# Patient Record
Sex: Female | Born: 1960 | Race: White | Hispanic: No | Marital: Married | Smoking: Never smoker
Health system: Southern US, Community
[De-identification: ages and names within clinical notes are randomized; demographics above are authoritative.]

## PROBLEM LIST (undated history)

## (undated) DIAGNOSIS — J45909 Unspecified asthma, uncomplicated: Secondary | ICD-10-CM

---

## 2009-02-05 ENCOUNTER — Ambulatory Visit (HOSPITAL_BASED_OUTPATIENT_CLINIC_OR_DEPARTMENT_OTHER): Admission: RE | Admit: 2009-02-05 | Discharge: 2009-02-05 | Payer: Self-pay | Admitting: Family Medicine

## 2009-02-05 ENCOUNTER — Ambulatory Visit: Payer: Self-pay | Admitting: Diagnostic Radiology

## 2013-12-09 ENCOUNTER — Emergency Department (HOSPITAL_BASED_OUTPATIENT_CLINIC_OR_DEPARTMENT_OTHER)
Admission: EM | Admit: 2013-12-09 | Discharge: 2013-12-09 | Disposition: A | Discharge status: Home / Self Care | Payer: BC Managed Care – PPO | Attending: Emergency Medicine | Admitting: Emergency Medicine

## 2013-12-09 ENCOUNTER — Encounter (HOSPITAL_BASED_OUTPATIENT_CLINIC_OR_DEPARTMENT_OTHER): Payer: Self-pay | Admitting: Emergency Medicine

## 2013-12-09 DIAGNOSIS — Z79899 Other long term (current) drug therapy: Secondary | ICD-10-CM | POA: Insufficient documentation

## 2013-12-09 DIAGNOSIS — S0990XA Unspecified injury of head, initial encounter: Secondary | ICD-10-CM | POA: Insufficient documentation

## 2013-12-09 DIAGNOSIS — Y9389 Activity, other specified: Secondary | ICD-10-CM | POA: Insufficient documentation

## 2013-12-09 DIAGNOSIS — IMO0002 Reserved for concepts with insufficient information to code with codable children: Secondary | ICD-10-CM | POA: Diagnosis not present

## 2013-12-09 DIAGNOSIS — S3981XA Other specified injuries of abdomen, initial encounter: Secondary | ICD-10-CM | POA: Insufficient documentation

## 2013-12-09 DIAGNOSIS — M791 Myalgia, unspecified site: Secondary | ICD-10-CM

## 2013-12-09 DIAGNOSIS — Y92410 Unspecified street and highway as the place of occurrence of the external cause: Secondary | ICD-10-CM | POA: Diagnosis not present

## 2013-12-09 DIAGNOSIS — J45909 Unspecified asthma, uncomplicated: Secondary | ICD-10-CM | POA: Insufficient documentation

## 2013-12-09 HISTORY — DX: Unspecified asthma, uncomplicated: J45.909

## 2013-12-09 MED ORDER — ACETAMINOPHEN 325 MG PO TABS
650.0000 mg | ORAL_TABLET | Freq: Four times a day (QID) | ORAL | Status: DC | PRN
  Administered 2013-12-09: 650 mg via ORAL
  Filled 2013-12-09: qty 2

## 2013-12-09 MED ORDER — ONDANSETRON 4 MG PO TBDP
4.0000 mg | ORAL_TABLET | Freq: Once | ORAL | Status: AC
  Administered 2013-12-09: 4 mg via ORAL
  Filled 2013-12-09: qty 1

## 2013-12-09 NOTE — ED Notes (Signed)
Provider at bedside for pt eval.
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
## 2013-12-09 NOTE — ED Provider Notes (Signed)
Medical screening examination/treatment/procedure(s) were performed by non-physician practitioner and as supervising physician I was immediately available for consultation/collaboration.   EKG Interpretation None        Vanetta MuldersScott Dailee Manalang, MD 12/09/13 1150
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
## 2014-01-20 ENCOUNTER — Other Ambulatory Visit: Payer: Self-pay | Admitting: Physician Assistant

## 2014-01-20 DIAGNOSIS — R1012 Left upper quadrant pain: Secondary | ICD-10-CM

## 2014-01-29 ENCOUNTER — Other Ambulatory Visit: Payer: BC Managed Care – PPO

## 2014-02-12 ENCOUNTER — Ambulatory Visit (INDEPENDENT_AMBULATORY_CARE_PROVIDER_SITE_OTHER): Payer: BC Managed Care – PPO

## 2014-02-12 DIAGNOSIS — R1012 Left upper quadrant pain: Secondary | ICD-10-CM

## 2014-02-12 DIAGNOSIS — K5730 Diverticulosis of large intestine without perforation or abscess without bleeding: Secondary | ICD-10-CM

## 2014-02-12 DIAGNOSIS — K760 Fatty (change of) liver, not elsewhere classified: Secondary | ICD-10-CM

## 2014-02-12 MED ORDER — IOHEXOL 300 MG/ML  SOLN
100.0000 mL | Freq: Once | INTRAMUSCULAR | Status: AC | PRN
  Administered 2014-02-12: 100 mL via INTRAVENOUS

## 2015-08-20 ENCOUNTER — Other Ambulatory Visit: Payer: Self-pay | Admitting: Gastroenterology

## 2015-08-20 ENCOUNTER — Ambulatory Visit
Admission: RE | Admit: 2015-08-20 | Discharge: 2015-08-20 | Disposition: A | Discharge status: Home / Self Care | Payer: BLUE CROSS/BLUE SHIELD | Source: Ambulatory Visit | Attending: Gastroenterology | Admitting: Gastroenterology

## 2015-08-20 DIAGNOSIS — R1032 Left lower quadrant pain: Secondary | ICD-10-CM

## 2015-08-20 DIAGNOSIS — R110 Nausea: Secondary | ICD-10-CM

## 2015-08-20 MED ORDER — IOPAMIDOL (ISOVUE-300) INJECTION 61%
100.0000 mL | Freq: Once | INTRAVENOUS | Status: AC | PRN
  Administered 2015-08-20: 100 mL via INTRAVENOUS

## 2016-02-16 DIAGNOSIS — M25522 Pain in left elbow: Secondary | ICD-10-CM | POA: Diagnosis not present

## 2016-02-16 DIAGNOSIS — J029 Acute pharyngitis, unspecified: Secondary | ICD-10-CM | POA: Diagnosis not present

## 2016-02-16 DIAGNOSIS — J329 Chronic sinusitis, unspecified: Secondary | ICD-10-CM | POA: Diagnosis not present

## 2016-02-22 DIAGNOSIS — M7989 Other specified soft tissue disorders: Secondary | ICD-10-CM | POA: Diagnosis not present

## 2016-03-11 DIAGNOSIS — M25522 Pain in left elbow: Secondary | ICD-10-CM | POA: Diagnosis not present

## 2016-03-22 IMAGING — CT CT ABD-PELV W/ CM
2 of 5 series · 12 of 32 positions shown, 17 images · IV contrast (omnipaque)
Comparison: 02/05/2009

CLINICAL DATA: Left upper quadrant pain and hardness, feels like
gas in upper abdominal region

EXAM:
CT ABDOMEN AND PELVIS WITH CONTRAST
TECHNIQUE: Multidetector CT imaging of the abdomen and pelvis was performed
using the standard protocol following bolus administration of
intravenous contrast.
CONTRAST:  100mL OMNIPAQUE IOHEXOL 300 MG/ML  SOLN

[Series 2: abd/pelvis with · axial · 0.63mm/px · z∈[-415,-165]mm · 4 of 84 slices shown, 9 images]
[im 17/84  soft-tissue]
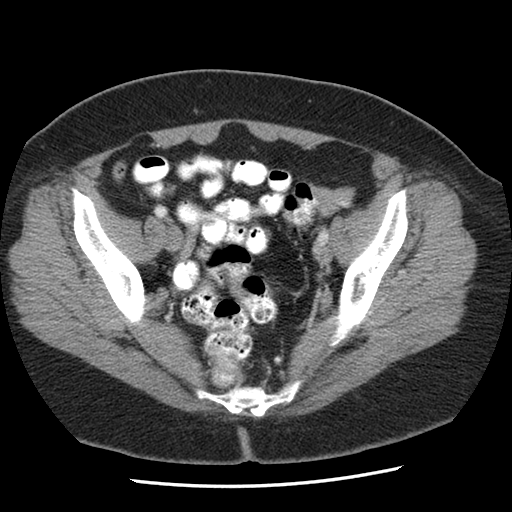
[im 17/84  lung]
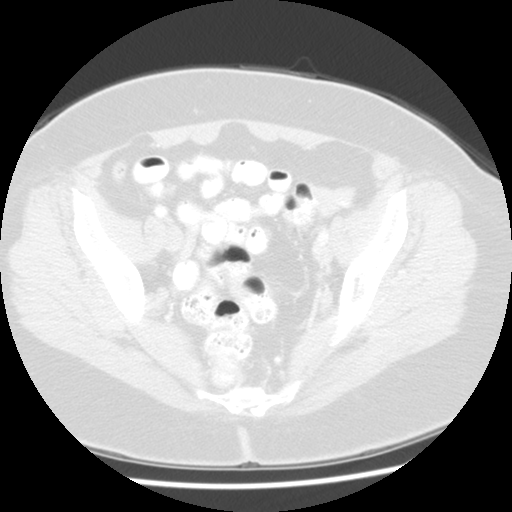
[im 17/84  bone]
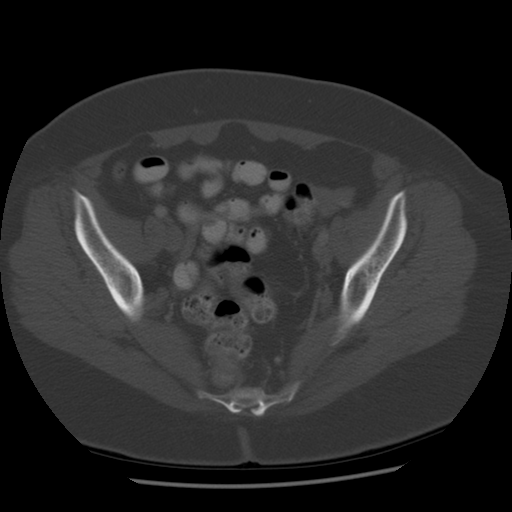
[im 34/84  soft-tissue]
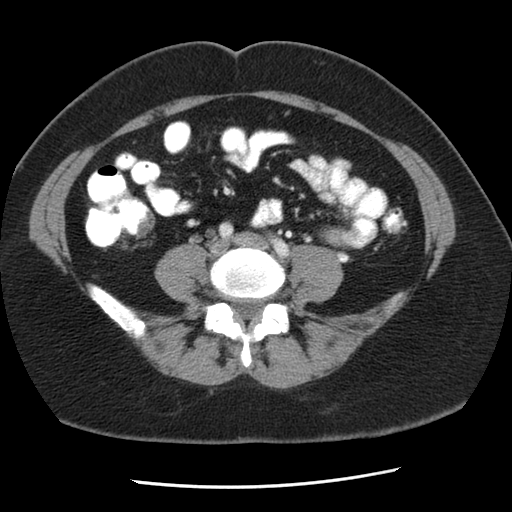
[im 34/84  lung]
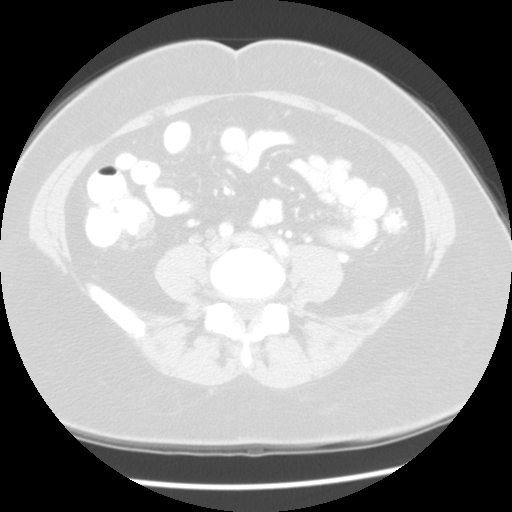
[im 50/84  soft-tissue]
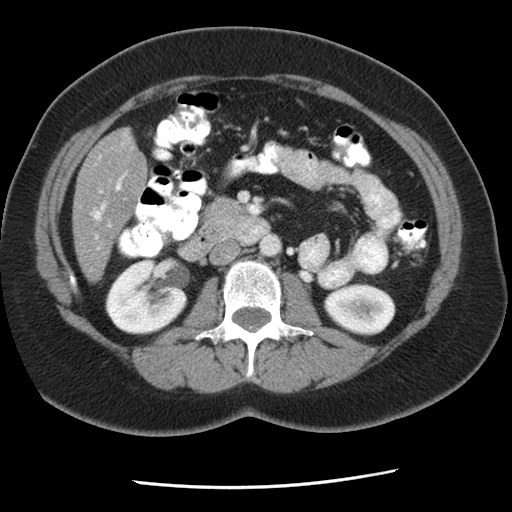
[im 50/84  lung]
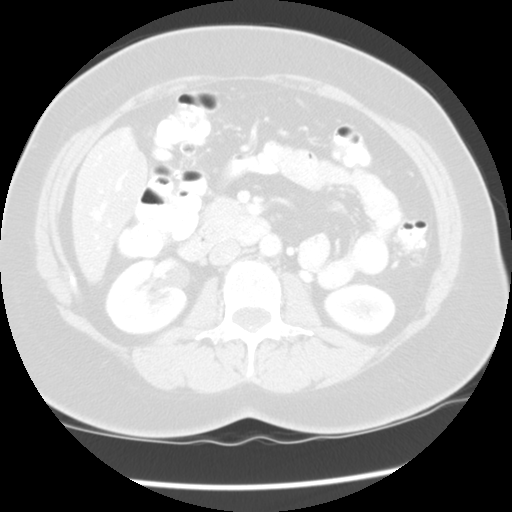
[im 67/84  soft-tissue]
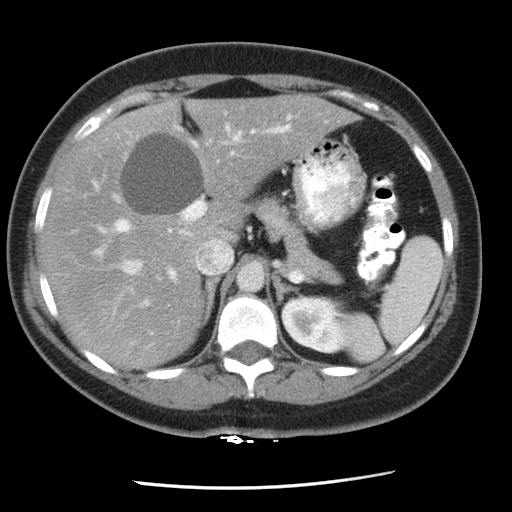
[im 67/84  lung]
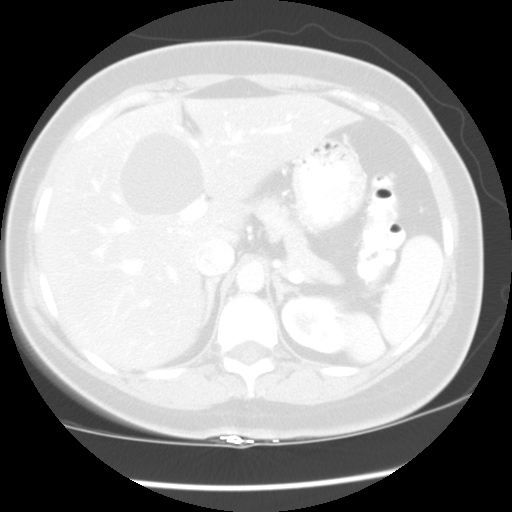

[Series 400: sag · sagittal · 0.92mm/px · 8 of 161 slices shown]
[im 15/161  soft-tissue]
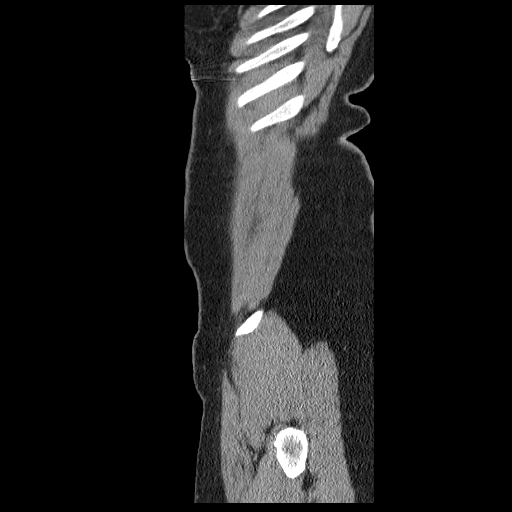
[im 30/161  soft-tissue]
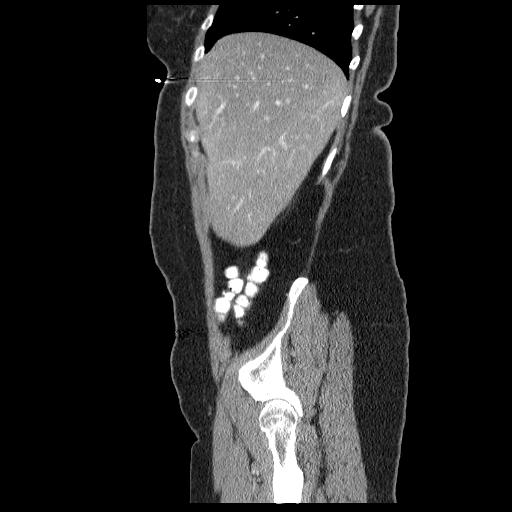
[im 59/161  soft-tissue]
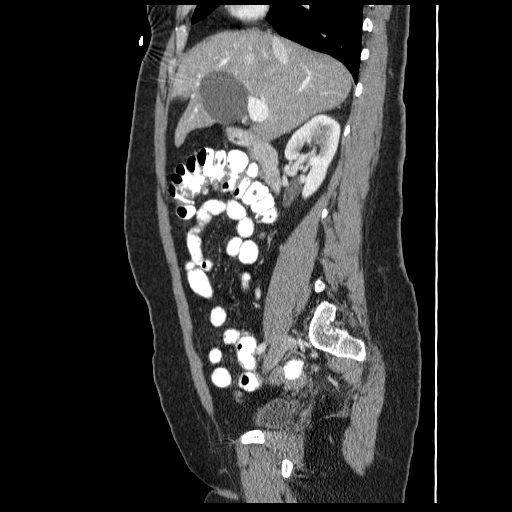
[im 73/161  soft-tissue]
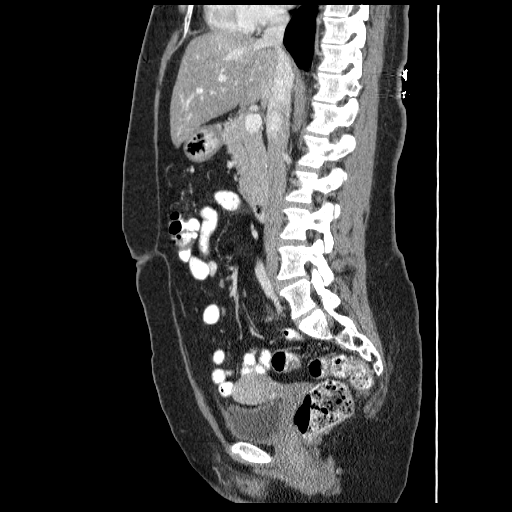
[im 88/161  soft-tissue]
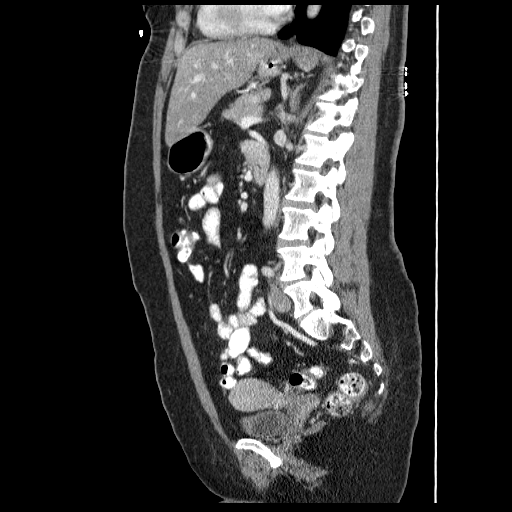
[im 102/161  soft-tissue]
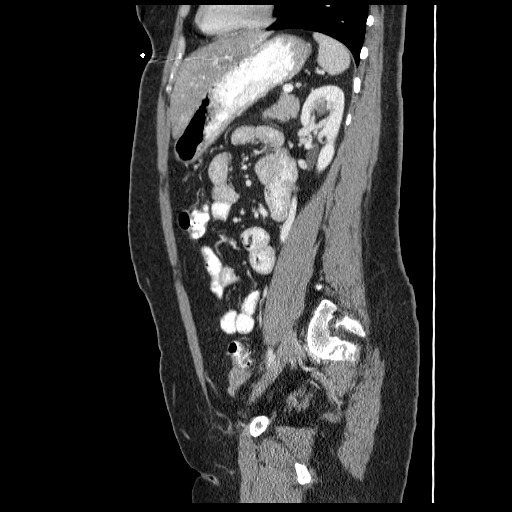
[im 131/161  soft-tissue]
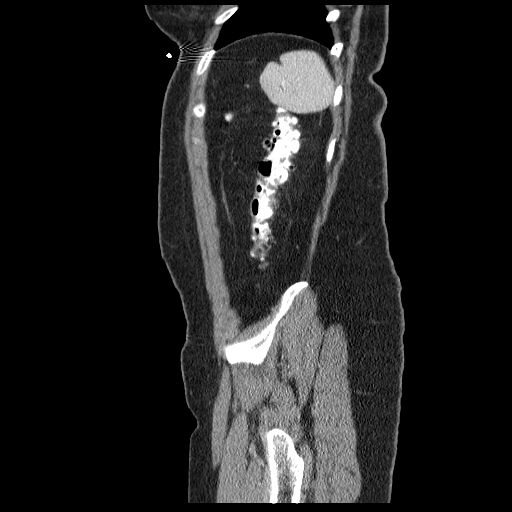
[im 146/161  soft-tissue]
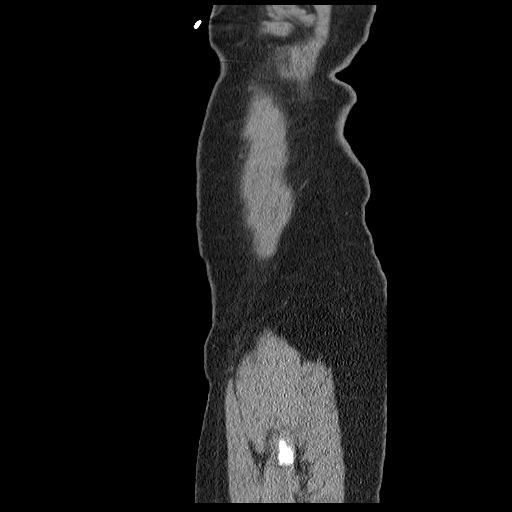

[12 of 32 positions shown; findings below may reference images not displayed]

FINDINGS: Sagittal images of the spine shows mild degenerative changes lower
thoracic spine. There is disc space flattening with mild posterior
spurring and mild spinal canal stenosis at T11-T12 level.

Lung bases are unremarkable.

Significant fatty infiltration of the liver again noted. Again noted
a large cyst in medial segment of the left hepatic lobe measures
by 4.8 cm. On the prior exam measures 5.3 x 4.7 cm. A smaller cyst
in right hepatic lobe laterally measures 1.1 cm stable in size in
appearance from prior exam.

The pancreas, spleen and adrenal glands are unremarkable.

Again noted a cyst in upper pole of the right kidney measures
mm. Kidneys are symmetrical in size and enhancement. There is a tiny
cyst in midpole of the left kidney measures 4 mm. Tiny cyst in lower
pole of the right kidney measures 5 mm. No hydronephrosis or
hydroureter. Delayed renal images shows bilateral renal symmetrical
excretion. Bilateral visualized proximal ureter is unremarkable. No
aortic aneurysm.

Oral contrast material was given to the patient. No small bowel
obstruction. No ascites or free air. Stable small nonspecific lymph
nodes in right lower quadrant mesentery the largest measures 6 mm
short-axis.

Normal appendix. No pericecal inflammation. The terminal ileum is
unremarkable. Moderate stool noted in rectosigmoid colon. Few
diverticula are noted proximal sigmoid colon. No evidence of acute
diverticulitis. Scattered diverticula are noted descending colon. No
acute diverticulitis.

The uterus and adnexa are unremarkable. No adnexal masses noted. The
urinary bladder is unremarkable.
IMPRESSION: 1. Stable hepatic cysts.  Stable subcentimeter renal cysts.
2. Significant fatty infiltration of the liver.
3. No hydronephrosis or hydroureter.
4. No small bowel obstruction.
5. Normal appendix.  No pericecal inflammation.
6. Scattered diverticula are noted descending colon. Few diverticula
are noted proximal sigmoid colon. No evidence of acute
diverticulitis.

## 2016-04-15 DIAGNOSIS — H903 Sensorineural hearing loss, bilateral: Secondary | ICD-10-CM | POA: Diagnosis not present

## 2016-04-20 DIAGNOSIS — J0100 Acute maxillary sinusitis, unspecified: Secondary | ICD-10-CM | POA: Diagnosis not present

## 2016-04-29 DIAGNOSIS — H903 Sensorineural hearing loss, bilateral: Secondary | ICD-10-CM | POA: Diagnosis not present

## 2016-07-15 DIAGNOSIS — J45909 Unspecified asthma, uncomplicated: Secondary | ICD-10-CM | POA: Diagnosis not present

## 2016-12-03 DIAGNOSIS — J029 Acute pharyngitis, unspecified: Secondary | ICD-10-CM | POA: Diagnosis not present

## 2016-12-03 DIAGNOSIS — J0140 Acute pansinusitis, unspecified: Secondary | ICD-10-CM | POA: Diagnosis not present

## 2016-12-05 DIAGNOSIS — J321 Chronic frontal sinusitis: Secondary | ICD-10-CM | POA: Diagnosis not present

## 2016-12-05 DIAGNOSIS — J45909 Unspecified asthma, uncomplicated: Secondary | ICD-10-CM | POA: Diagnosis not present

## 2016-12-21 DIAGNOSIS — R300 Dysuria: Secondary | ICD-10-CM | POA: Diagnosis not present

## 2016-12-21 DIAGNOSIS — N3001 Acute cystitis with hematuria: Secondary | ICD-10-CM | POA: Diagnosis not present

## 2016-12-27 DIAGNOSIS — N3000 Acute cystitis without hematuria: Secondary | ICD-10-CM | POA: Diagnosis not present

## 2017-06-02 DIAGNOSIS — J069 Acute upper respiratory infection, unspecified: Secondary | ICD-10-CM | POA: Diagnosis not present

## 2017-06-08 DIAGNOSIS — J069 Acute upper respiratory infection, unspecified: Secondary | ICD-10-CM | POA: Diagnosis not present

## 2017-07-13 DIAGNOSIS — H9201 Otalgia, right ear: Secondary | ICD-10-CM | POA: Diagnosis not present

## 2017-07-13 DIAGNOSIS — J45909 Unspecified asthma, uncomplicated: Secondary | ICD-10-CM | POA: Diagnosis not present

## 2017-07-21 DIAGNOSIS — J3489 Other specified disorders of nose and nasal sinuses: Secondary | ICD-10-CM | POA: Diagnosis not present

## 2017-07-21 DIAGNOSIS — H9209 Otalgia, unspecified ear: Secondary | ICD-10-CM | POA: Diagnosis not present

## 2017-12-29 DIAGNOSIS — Z1231 Encounter for screening mammogram for malignant neoplasm of breast: Secondary | ICD-10-CM | POA: Diagnosis not present

## 2018-01-04 DIAGNOSIS — Z01419 Encounter for gynecological examination (general) (routine) without abnormal findings: Secondary | ICD-10-CM | POA: Diagnosis not present

## 2018-03-14 DIAGNOSIS — Z8719 Personal history of other diseases of the digestive system: Secondary | ICD-10-CM | POA: Diagnosis not present

## 2018-03-14 DIAGNOSIS — Z7689 Persons encountering health services in other specified circumstances: Secondary | ICD-10-CM | POA: Diagnosis not present

## 2018-03-14 DIAGNOSIS — J4530 Mild persistent asthma, uncomplicated: Secondary | ICD-10-CM | POA: Diagnosis not present

## 2018-03-14 DIAGNOSIS — J0140 Acute pansinusitis, unspecified: Secondary | ICD-10-CM | POA: Diagnosis not present

## 2018-09-24 DIAGNOSIS — M79641 Pain in right hand: Secondary | ICD-10-CM | POA: Diagnosis not present

## 2018-09-24 DIAGNOSIS — M79671 Pain in right foot: Secondary | ICD-10-CM | POA: Diagnosis not present

## 2018-09-24 DIAGNOSIS — S92511A Displaced fracture of proximal phalanx of right lesser toe(s), initial encounter for closed fracture: Secondary | ICD-10-CM | POA: Diagnosis not present

## 2018-09-24 DIAGNOSIS — S92514A Nondisplaced fracture of proximal phalanx of right lesser toe(s), initial encounter for closed fracture: Secondary | ICD-10-CM | POA: Diagnosis not present

## 2019-01-29 DIAGNOSIS — Z01419 Encounter for gynecological examination (general) (routine) without abnormal findings: Secondary | ICD-10-CM | POA: Diagnosis not present

## 2019-01-29 DIAGNOSIS — I471 Supraventricular tachycardia: Secondary | ICD-10-CM | POA: Diagnosis not present

## 2019-01-29 DIAGNOSIS — R072 Precordial pain: Secondary | ICD-10-CM | POA: Diagnosis not present

## 2019-01-29 DIAGNOSIS — M67431 Ganglion, right wrist: Secondary | ICD-10-CM | POA: Diagnosis not present

## 2019-02-05 DIAGNOSIS — M67431 Ganglion, right wrist: Secondary | ICD-10-CM | POA: Diagnosis not present

## 2019-02-05 DIAGNOSIS — J4530 Mild persistent asthma, uncomplicated: Secondary | ICD-10-CM | POA: Diagnosis not present

## 2019-02-08 DIAGNOSIS — Z1231 Encounter for screening mammogram for malignant neoplasm of breast: Secondary | ICD-10-CM | POA: Diagnosis not present

## 2019-03-08 DIAGNOSIS — R079 Chest pain, unspecified: Secondary | ICD-10-CM | POA: Diagnosis not present

## 2019-03-08 DIAGNOSIS — Z780 Asymptomatic menopausal state: Secondary | ICD-10-CM | POA: Diagnosis not present

## 2019-03-08 DIAGNOSIS — I479 Paroxysmal tachycardia, unspecified: Secondary | ICD-10-CM | POA: Diagnosis not present

## 2019-03-08 DIAGNOSIS — Z8249 Family history of ischemic heart disease and other diseases of the circulatory system: Secondary | ICD-10-CM | POA: Diagnosis not present

## 2019-03-08 DIAGNOSIS — J4520 Mild intermittent asthma, uncomplicated: Secondary | ICD-10-CM | POA: Diagnosis not present

## 2019-03-11 DIAGNOSIS — Z01818 Encounter for other preprocedural examination: Secondary | ICD-10-CM | POA: Diagnosis not present

## 2019-03-14 DIAGNOSIS — I480 Paroxysmal atrial fibrillation: Secondary | ICD-10-CM | POA: Diagnosis not present

## 2019-03-14 DIAGNOSIS — Z801 Family history of malignant neoplasm of trachea, bronchus and lung: Secondary | ICD-10-CM | POA: Diagnosis not present

## 2019-03-14 DIAGNOSIS — Z803 Family history of malignant neoplasm of breast: Secondary | ICD-10-CM | POA: Diagnosis not present

## 2019-03-14 DIAGNOSIS — F321 Major depressive disorder, single episode, moderate: Secondary | ICD-10-CM | POA: Diagnosis not present

## 2019-03-14 DIAGNOSIS — J45909 Unspecified asthma, uncomplicated: Secondary | ICD-10-CM | POA: Diagnosis not present

## 2019-03-14 DIAGNOSIS — Z881 Allergy status to other antibiotic agents status: Secondary | ICD-10-CM | POA: Diagnosis not present

## 2019-03-14 DIAGNOSIS — M67431 Ganglion, right wrist: Secondary | ICD-10-CM | POA: Diagnosis not present

## 2019-03-14 DIAGNOSIS — Z800 Family history of malignant neoplasm of digestive organs: Secondary | ICD-10-CM | POA: Diagnosis not present

## 2019-03-14 DIAGNOSIS — Z79899 Other long term (current) drug therapy: Secondary | ICD-10-CM | POA: Diagnosis not present

## 2019-03-14 DIAGNOSIS — M6740 Ganglion, unspecified site: Secondary | ICD-10-CM | POA: Diagnosis not present

## 2019-03-14 DIAGNOSIS — Z808 Family history of malignant neoplasm of other organs or systems: Secondary | ICD-10-CM | POA: Diagnosis not present

## 2019-03-14 DIAGNOSIS — Z91018 Allergy to other foods: Secondary | ICD-10-CM | POA: Diagnosis not present

## 2019-03-29 DIAGNOSIS — M67431 Ganglion, right wrist: Secondary | ICD-10-CM | POA: Diagnosis not present

## 2019-03-29 DIAGNOSIS — Z4889 Encounter for other specified surgical aftercare: Secondary | ICD-10-CM | POA: Diagnosis not present

## 2019-04-09 DIAGNOSIS — Z8249 Family history of ischemic heart disease and other diseases of the circulatory system: Secondary | ICD-10-CM | POA: Diagnosis not present

## 2019-04-09 DIAGNOSIS — R079 Chest pain, unspecified: Secondary | ICD-10-CM | POA: Diagnosis not present

## 2019-04-09 DIAGNOSIS — J4520 Mild intermittent asthma, uncomplicated: Secondary | ICD-10-CM | POA: Diagnosis not present

## 2019-04-09 DIAGNOSIS — Z780 Asymptomatic menopausal state: Secondary | ICD-10-CM | POA: Diagnosis not present

## 2019-04-09 DIAGNOSIS — H903 Sensorineural hearing loss, bilateral: Secondary | ICD-10-CM | POA: Diagnosis not present

## 2019-04-16 DIAGNOSIS — Z4889 Encounter for other specified surgical aftercare: Secondary | ICD-10-CM | POA: Diagnosis not present

## 2019-04-16 DIAGNOSIS — H903 Sensorineural hearing loss, bilateral: Secondary | ICD-10-CM | POA: Diagnosis not present

## 2019-05-07 DIAGNOSIS — H903 Sensorineural hearing loss, bilateral: Secondary | ICD-10-CM | POA: Diagnosis not present

## 2019-05-14 DIAGNOSIS — R079 Chest pain, unspecified: Secondary | ICD-10-CM | POA: Diagnosis not present

## 2019-05-14 DIAGNOSIS — Z780 Asymptomatic menopausal state: Secondary | ICD-10-CM | POA: Diagnosis not present

## 2019-05-14 DIAGNOSIS — Z8249 Family history of ischemic heart disease and other diseases of the circulatory system: Secondary | ICD-10-CM | POA: Diagnosis not present

## 2019-05-14 DIAGNOSIS — J4520 Mild intermittent asthma, uncomplicated: Secondary | ICD-10-CM | POA: Diagnosis not present

## 2019-05-17 DIAGNOSIS — E559 Vitamin D deficiency, unspecified: Secondary | ICD-10-CM | POA: Diagnosis not present

## 2019-05-17 DIAGNOSIS — J3089 Other allergic rhinitis: Secondary | ICD-10-CM | POA: Diagnosis not present

## 2019-05-17 DIAGNOSIS — Z13220 Encounter for screening for lipoid disorders: Secondary | ICD-10-CM | POA: Diagnosis not present

## 2019-05-17 DIAGNOSIS — I479 Paroxysmal tachycardia, unspecified: Secondary | ICD-10-CM | POA: Diagnosis not present

## 2019-05-17 DIAGNOSIS — Z0000 Encounter for general adult medical examination without abnormal findings: Secondary | ICD-10-CM | POA: Diagnosis not present

## 2019-06-28 DIAGNOSIS — I479 Paroxysmal tachycardia, unspecified: Secondary | ICD-10-CM | POA: Diagnosis not present

## 2019-06-28 DIAGNOSIS — J4520 Mild intermittent asthma, uncomplicated: Secondary | ICD-10-CM | POA: Diagnosis not present

## 2020-01-31 DIAGNOSIS — M25511 Pain in right shoulder: Secondary | ICD-10-CM | POA: Diagnosis not present

## 2020-02-17 DIAGNOSIS — J4530 Mild persistent asthma, uncomplicated: Secondary | ICD-10-CM | POA: Diagnosis not present

## 2020-02-17 DIAGNOSIS — K5901 Slow transit constipation: Secondary | ICD-10-CM | POA: Diagnosis not present

## 2020-02-17 DIAGNOSIS — M25512 Pain in left shoulder: Secondary | ICD-10-CM | POA: Diagnosis not present

## 2020-03-06 DIAGNOSIS — M25512 Pain in left shoulder: Secondary | ICD-10-CM | POA: Diagnosis not present

## 2020-03-06 DIAGNOSIS — M7522 Bicipital tendinitis, left shoulder: Secondary | ICD-10-CM | POA: Diagnosis not present

## 2020-03-13 DIAGNOSIS — R519 Headache, unspecified: Secondary | ICD-10-CM | POA: Diagnosis not present

## 2020-03-27 DIAGNOSIS — Z1231 Encounter for screening mammogram for malignant neoplasm of breast: Secondary | ICD-10-CM | POA: Diagnosis not present

## 2020-04-22 DIAGNOSIS — R519 Headache, unspecified: Secondary | ICD-10-CM | POA: Diagnosis not present

## 2020-04-22 DIAGNOSIS — Z20822 Contact with and (suspected) exposure to covid-19: Secondary | ICD-10-CM | POA: Diagnosis not present
